# Patient Record
Sex: Female | Born: 2005 | Race: White | Hispanic: No | Marital: Single | Smoking: Never smoker
Health system: Southern US, Community
[De-identification: ages and names within clinical notes are randomized; demographics above are authoritative.]

## PROBLEM LIST (undated history)

## (undated) DIAGNOSIS — R569 Unspecified convulsions: Secondary | ICD-10-CM

## (undated) DIAGNOSIS — T751XXA Unspecified effects of drowning and nonfatal submersion, initial encounter: Secondary | ICD-10-CM

## (undated) DIAGNOSIS — K5900 Constipation, unspecified: Secondary | ICD-10-CM

## (undated) DIAGNOSIS — T7840XA Allergy, unspecified, initial encounter: Secondary | ICD-10-CM

## (undated) DIAGNOSIS — G40909 Epilepsy, unspecified, not intractable, without status epilepticus: Secondary | ICD-10-CM

## (undated) DIAGNOSIS — L503 Dermatographic urticaria: Secondary | ICD-10-CM

## (undated) HISTORY — DX: Unspecified effects of drowning and nonfatal submersion, initial encounter: T75.1XXA

## (undated) HISTORY — DX: Constipation, unspecified: K59.00

## (undated) HISTORY — DX: Allergy, unspecified, initial encounter: T78.40XA

## (undated) HISTORY — DX: Unspecified convulsions: R56.9

---
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
## 2023-01-03 ENCOUNTER — Other Ambulatory Visit (INDEPENDENT_AMBULATORY_CARE_PROVIDER_SITE_OTHER): Payer: Self-pay | Admitting: Neurology

## 2023-02-15 ENCOUNTER — Encounter (INDEPENDENT_AMBULATORY_CARE_PROVIDER_SITE_OTHER): Payer: Self-pay | Admitting: Neurology

## 2023-05-20 ENCOUNTER — Other Ambulatory Visit (INDEPENDENT_AMBULATORY_CARE_PROVIDER_SITE_OTHER): Payer: Self-pay | Admitting: Neurology

## 2023-05-20 NOTE — Telephone Encounter (Signed)
Last OV: 11-30-2022  Next OV: Not scheduled  Last Rx: 12-24-2022 with 5 refills (last dispensed: 03-23-2023)  B. Roten CMA

## 2023-06-27 ENCOUNTER — Ambulatory Visit (INDEPENDENT_AMBULATORY_CARE_PROVIDER_SITE_OTHER): Payer: Commercial Managed Care - PPO | Admitting: Neurology

## 2023-06-27 DIAGNOSIS — G40319 Generalized idiopathic epilepsy and epileptic syndromes, intractable, without status epilepticus: Secondary | ICD-10-CM

## 2023-06-27 NOTE — Progress Notes (Unsigned)
EEG complete - results pending 

## 2023-06-29 NOTE — Procedures (Signed)
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  This is a follow-up EEG for evaluation of epileptiform discharges.  Medication:    Lamictal, Onfi           Procedure: The tracing was carried out on a 32 channel digital Cadwell recorder reformatted into 16 channel montages with 1 devoted to EKG.  The 10 /20 international system electrode placement was used. Recording was done during awake state. Recording time 33 minutes.   Description of findings: Background rhythm consists of amplitude of     35 microvolt and frequency of 10-11 hertz posterior dominant rhythm. There was normal anterior posterior gradient noted. Background was well organized, continuous and symmetric with no focal slowing. There was muscle artifact noted. Hyperventilation resulted in slowing of the background activity. Photic stimulation was not performed. Throughout the recording there were no focal or generalized epileptiform activities in the form of spikes or sharps noted. There were no transient rhythmic activities or electrographic seizures noted. One lead EKG rhythm strip revealed sinus rhythm at a rate of 75 bpm.  Impression: This EEG during awake state and without photic stimulation is normal.  Please note that normal EEG does not exclude epilepsy, clinical correlation is indicated.     Keturah Shavers, MD

## 2023-07-19 ENCOUNTER — Ambulatory Visit (INDEPENDENT_AMBULATORY_CARE_PROVIDER_SITE_OTHER): Payer: Commercial Managed Care - PPO | Admitting: Neurology

## 2023-07-26 ENCOUNTER — Other Ambulatory Visit (INDEPENDENT_AMBULATORY_CARE_PROVIDER_SITE_OTHER): Payer: Self-pay | Admitting: Neurology

## 2023-07-26 DIAGNOSIS — G40319 Generalized idiopathic epilepsy and epileptic syndromes, intractable, without status epilepticus: Secondary | ICD-10-CM

## 2023-07-26 DIAGNOSIS — G253 Myoclonus: Secondary | ICD-10-CM

## 2023-07-26 NOTE — Telephone Encounter (Signed)
Last OV 06/27/23 Next OV 09/19/23 Lamictal 200 mg ER 11/30/2022 3 RF Lamictal 50 mg ER 11/30/2022 3 rf Per dispense hx has not missed picking up medication

## 2023-08-29 ENCOUNTER — Other Ambulatory Visit (INDEPENDENT_AMBULATORY_CARE_PROVIDER_SITE_OTHER): Payer: Self-pay | Admitting: Neurology

## 2023-08-29 DIAGNOSIS — G253 Myoclonus: Secondary | ICD-10-CM

## 2023-08-29 DIAGNOSIS — G40319 Generalized idiopathic epilepsy and epileptic syndromes, intractable, without status epilepticus: Secondary | ICD-10-CM

## 2023-09-19 ENCOUNTER — Encounter (INDEPENDENT_AMBULATORY_CARE_PROVIDER_SITE_OTHER): Payer: Self-pay | Admitting: Neurology

## 2023-09-19 ENCOUNTER — Ambulatory Visit (INDEPENDENT_AMBULATORY_CARE_PROVIDER_SITE_OTHER): Payer: Commercial Managed Care - PPO | Admitting: Neurology

## 2023-09-19 VITALS — BP 118/58 | HR 68

## 2023-09-19 DIAGNOSIS — G253 Myoclonus: Secondary | ICD-10-CM

## 2023-09-19 DIAGNOSIS — G40309 Generalized idiopathic epilepsy and epileptic syndromes, not intractable, without status epilepticus: Secondary | ICD-10-CM

## 2023-09-19 DIAGNOSIS — G40319 Generalized idiopathic epilepsy and epileptic syndromes, intractable, without status epilepticus: Secondary | ICD-10-CM | POA: Diagnosis not present

## 2023-09-19 MED ORDER — CLOBAZAM 10 MG PO TABS: ORAL_TABLET | ORAL | 5 refills | Status: DC

## 2023-09-19 MED ORDER — LAMOTRIGINE ER 50 MG PO TB24
ORAL_TABLET | ORAL | 3 refills | Status: DC
Start: 2023-09-19 — End: 2023-11-15

## 2023-09-19 MED ORDER — LAMOTRIGINE ER 100 MG PO TB24
ORAL_TABLET | ORAL | 3 refills | Status: DC
Start: 2023-09-19 — End: 2023-11-15

## 2023-09-19 MED ORDER — LAMOTRIGINE ER 200 MG PO TB24
ORAL_TABLET | ORAL | 3 refills | Status: DC
Start: 2023-09-19 — End: 2023-11-15
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
## 2023-09-19 NOTE — Patient Instructions (Addendum)
Continue the same dose of Lamictal at 350 mg Continue Onfi at the same dose of 20 mg every night We will schedule for blood work Continue with adequate sleep and limited screen time Avoid straight sunlight We will send a referral to adult neurology No follow-up visit needed at this time

## 2023-09-21 ENCOUNTER — Encounter: Payer: Self-pay | Admitting: Neurology

## 2023-10-31 ENCOUNTER — Telehealth (INDEPENDENT_AMBULATORY_CARE_PROVIDER_SITE_OTHER): Payer: Self-pay | Admitting: Neurology

## 2023-10-31 NOTE — Telephone Encounter (Signed)
Left mom message stating that I seen her message about medication, I am sending this message over to Dr.Nab to further advise on the medication

## 2023-10-31 NOTE — Telephone Encounter (Signed)
  Name of who is calling: Tana Coast Relationship to Patient: Mom  Best contact number: [phone]  Provider they see: Dr.Nab  Reason for call: Mom Nurse Line 10/29/23 and stated that Rosemaria missed her On[place] the night before 10/28/23 and wanted some dosing advice for this since she had to take the dose that morning and it usually scheduled for her to take at night.      PRESCRIPTION REFILL ONLY  Name of prescription:  Pharmacy:

## 2023-11-15 ENCOUNTER — Ambulatory Visit: Payer: Commercial Managed Care - PPO | Admitting: Neurology

## 2023-11-15 ENCOUNTER — Encounter: Payer: Self-pay | Admitting: Neurology

## 2023-11-15 DIAGNOSIS — G253 Myoclonus: Secondary | ICD-10-CM

## 2023-11-15 DIAGNOSIS — G40319 Generalized idiopathic epilepsy and epileptic syndromes, intractable, without status epilepticus: Secondary | ICD-10-CM

## 2023-11-15 MED ORDER — LAMOTRIGINE ER 200 MG PO TB24: ORAL_TABLET | ORAL | 3 refills | Status: DC

## 2023-11-15 MED ORDER — CLOBAZAM 10 MG PO TABS: ORAL_TABLET | ORAL | 3 refills | Status: DC
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
## 2023-12-23 ENCOUNTER — Other Ambulatory Visit: Payer: Self-pay | Admitting: Neurology

## 2023-12-23 NOTE — Telephone Encounter (Signed)
1. Which medications need to be refilled? (please list name of each medication and dose if known) cloBAZam (ONFI) 10 MG tablet   2. Which pharmacy/location (including street and city if local pharmacy) is medication to be sent to? [facility], [place] - [address]   3. Do they need a 30 day or 90 day supply?

## 2023-12-26 ENCOUNTER — Telehealth: Payer: Self-pay | Admitting: Neurology

## 2023-12-26 MED ORDER — CLOBAZAM 10 MG PO TABS: ORAL_TABLET | ORAL | 3 refills | Status: DC

## 2023-12-26 NOTE — Telephone Encounter (Signed)
PER pharmacy PA needed needed for Clobazm.  PA team please start a PA

## 2023-12-26 NOTE — Telephone Encounter (Signed)
Pt's mother called in wanting to find out what the pt is supposed to do without her clobazam tomorrow? She is in [place] at school.

## 2023-12-26 NOTE — Telephone Encounter (Signed)
Patient needs to speak to someone about  the Clobazam refill. The pharmacy was to send something to [place] about the refill   [facility] [address]

## 2023-12-27 MED ORDER — CLOBAZAM 20 MG PO TABS: ORAL_TABLET | ORAL | 0 refills | Status: DC

## 2023-12-27 NOTE — Telephone Encounter (Signed)
Mother advise of [employer] to printed or send the coupon in a text to her daughter.    [facility] [address] [place]

## 2023-12-27 NOTE — Telephone Encounter (Signed)
Rx sent for temporary supply of Clobazam. Pls make sure mother and patient aware that [facility] , so she only needs to take 1 tablet every night (right now she is on clobazam 10mg  taking 2 tabs every night, so it is the same dose, just make sure she does not take more than prescribed). Thank you!!

## 2024-01-03 ENCOUNTER — Telehealth: Payer: Self-pay | Admitting: Pharmacy Technician

## 2024-01-03 ENCOUNTER — Other Ambulatory Visit (HOSPITAL_COMMUNITY): Payer: Self-pay

## 2024-01-03 NOTE — Telephone Encounter (Signed)
 Pharmacy Patient Advocate Encounter   Received notification from Fax that prior authorization for CLOBAZAM  20MG  is required/requested.   Insurance verification completed.   The patient is insured through [facility] .   Per test claim: PA required; PA submitted to above mentioned insurance via Prompt PA Key/confirmation #/EOC [id number] Status is pending

## 2024-01-03 NOTE — Telephone Encounter (Signed)
 Pharmacy Patient Advocate Encounter  Received notification from [employer] that Prior Authorization for CLOBAZAM  20MG  has been APPROVED from 2.4.25 to 2.3.26. Ran test claim, Copay is $15. This test claim was processed through [facility]- copay amounts may vary at other pharmacies due to pharmacy/plan contracts, or as the patient moves through the different stages of their insurance plan.   PA #/Case ID/Reference #: [id number]

## 2024-01-23 ENCOUNTER — Telehealth: Payer: Self-pay | Admitting: Neurology

## 2024-01-23 DIAGNOSIS — G253 Myoclonus: Secondary | ICD-10-CM

## 2024-01-23 DIAGNOSIS — G40319 Generalized idiopathic epilepsy and epileptic syndromes, intractable, without status epilepticus: Secondary | ICD-10-CM

## 2024-01-23 NOTE — Telephone Encounter (Signed)
 Caller stated she would like to speak with Herbert Seta concerning daughters medication.

## 2024-01-24 MED ORDER — CLOBAZAM 20 MG PO TABS: ORAL_TABLET | ORAL | 3 refills | Status: DC

## 2024-01-24 MED ORDER — LAMOTRIGINE ER 200 MG PO TB24: ORAL_TABLET | ORAL | 3 refills | Status: DC

## 2024-01-24 NOTE — Telephone Encounter (Signed)
 Rx sent, thanks

## 2024-01-27 ENCOUNTER — Telehealth: Payer: Self-pay

## 2024-01-27 NOTE — Telephone Encounter (Signed)
 Pt needs a PA for lamotrigine ER 200mg 

## 2024-02-01 ENCOUNTER — Telehealth: Payer: Self-pay

## 2024-02-01 ENCOUNTER — Other Ambulatory Visit (HOSPITAL_COMMUNITY): Payer: Self-pay

## 2024-02-01 NOTE — Telephone Encounter (Signed)
 PA request has been Cancelled. New Encounter has been or will be created for follow up. For additional info see Pharmacy Prior Auth telephone encounter from 03/05.  Refill too soon

## 2024-02-01 NOTE — Telephone Encounter (Signed)
*  [facility]  Pharmacy Patient Advocate Encounter   Received notification from Pt Calls Messages that prior authorization for Lamotrigine ER 200mg  is required/requested.   Insurance verification completed.   The patient is insured through  [employer]  .   Per test claim: Refill too soon. PA is not needed at this time. Medication was filled 01/22/2024. Next eligible fill date is 02/14/2024.

## 2024-02-07 ENCOUNTER — Other Ambulatory Visit (HOSPITAL_COMMUNITY): Payer: Self-pay

## 2024-02-08 ENCOUNTER — Other Ambulatory Visit: Payer: Self-pay

## 2024-02-08 MED ORDER — CLOBAZAM 20 MG PO TABS: ORAL_TABLET | ORAL | 3 refills | Status: DC

## 2024-02-08 NOTE — Telephone Encounter (Signed)
 Pt needs a PA  for Lamotrigine ER 200mg  request from cover my meds from the pharmacy.

## 2024-02-13 ENCOUNTER — Ambulatory Visit: Payer: Self-pay | Admitting: Neurology

## 2024-08-06 ENCOUNTER — Telehealth: Payer: Self-pay | Admitting: Neurology

## 2024-08-06 NOTE — Telephone Encounter (Signed)
 She is on 2 seizure medications, pls confirm which ones and how much she took this morning, thanks

## 2024-08-06 NOTE — Telephone Encounter (Signed)
 Pt called and LM with AN. She is unsure if she took her epilepsy medication last night 9/5 so she took a dose this morning 9/6. She wants to know if she will be ok if she did take her night time dose

## 2024-08-07 NOTE — Telephone Encounter (Signed)
 Called pt and told her per Dr Georjean to be put on waiting list , sent call to front

## 2024-08-16 ENCOUNTER — Other Ambulatory Visit: Payer: Self-pay | Admitting: Neurology

## 2024-08-16 ENCOUNTER — Other Ambulatory Visit: Payer: Self-pay | Admitting: *Deleted

## 2024-08-19 ENCOUNTER — Other Ambulatory Visit: Payer: Self-pay | Admitting: Neurology

## 2024-08-21 ENCOUNTER — Telehealth: Payer: Self-pay | Admitting: Neurology

## 2024-08-21 ENCOUNTER — Ambulatory Visit (INDEPENDENT_AMBULATORY_CARE_PROVIDER_SITE_OTHER): Admitting: Neurology

## 2024-08-21 ENCOUNTER — Other Ambulatory Visit

## 2024-08-21 ENCOUNTER — Encounter: Payer: Self-pay | Admitting: Neurology

## 2024-08-21 VITALS — BP 114/78 | HR 85 | Ht 65.0 in | Wt 119.0 lb

## 2024-08-21 DIAGNOSIS — R400 Somnolence: Secondary | ICD-10-CM

## 2024-08-21 DIAGNOSIS — G40319 Generalized idiopathic epilepsy and epileptic syndromes, intractable, without status epilepticus: Secondary | ICD-10-CM | POA: Diagnosis not present

## 2024-08-21 MED ORDER — CLOBAZAM 20 MG PO TABS: 20.0000 mg | ORAL_TABLET | Freq: Every day | ORAL | 3 refills | Status: DC

## 2024-08-21 MED ORDER — VALTOCO 15 MG DOSE 2 X 7.5 MG/0.1ML NA LQPK: NASAL | 5 refills | Status: AC
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
## 2024-08-21 NOTE — Patient Instructions (Addendum)
 Good to see you.  Have bloodwork done for Lamictal  level [address]  2. Schedule home sleep study  3. Depending on Lamictal  level, we may plan to increase dose of Lamictal  to 500mg  every night. For now, continue all medications  4. Follow-up in 3-4 months, call for any changes   Seizure Precautions: 1. If medication has been prescribed for you to prevent seizures, take it exactly as directed.  Do not stop taking the medicine without talking to your doctor first, even if you have not had a seizure in a long time.   2. Avoid activities in which a seizure would cause danger to yourself or to others.  Don't operate dangerous machinery, swim alone, or climb in high or dangerous places, such as on ladders, roofs, or girders.  Do not drive unless your doctor says you may.  3. If you have any warning that you may have a seizure, lay down in a safe place where you can't hurt yourself.    4.  No driving for 6 months from last seizure, as per [place]  state law.   Please refer to the following link on the Epilepsy Foundation of America's website for more information: http://www.epilepsyfoundation.org/answerplace/Social/driving/drivingu.cfm   5.  Maintain good sleep hygiene. Avoid alcohol.  6.  Notify your neurology if you are planning pregnancy or if you become pregnant.  7.  Contact your doctor if you have any problems that may be related to the medicine you are taking.  8.  Call 911 and bring the patient back to the ED if:        A.  The seizure lasts longer than 5 minutes.       B.  The patient doesn't awaken shortly after the seizure  C.  The patient has new problems such as difficulty seeing, speaking or moving  D.  The patient was injured during the seizure  E.  The patient has a temperature over 102 F (39C)  F.  The patient vomited and now is having trouble breathing

## 2024-08-21 NOTE — Telephone Encounter (Signed)
 Pt. Would like to discuss and follow thru with VNS therapy, pls call bk with details

## 2024-08-23 NOTE — Telephone Encounter (Signed)
 Pt called back today wanting to speak to someone about the message she left the other day ( see below) please call back today    Would like to discuss and follow thru with VNS therapy, pls call bk with details

## 2024-08-23 NOTE — Telephone Encounter (Signed)
 Notified message below and voiced understanding.

## 2024-08-23 NOTE — Telephone Encounter (Signed)
 Pls let her know that the next step would be setting up an appointment with the neurosurgeon to discuss the procedure. The VNS representative reaches out to discuss benefits and answer all her questions. I have sent in the order, she should be hearing from them soon, thanks

## 2024-08-24 LAB — LAMOTRIGINE LEVEL: Lamotrigine Lvl: 9.7 ug/mL (ref 2.5–15.0)

## 2024-08-29 ENCOUNTER — Ambulatory Visit: Payer: Self-pay | Admitting: Neurology

## 2024-08-29 MED ORDER — LAMOTRIGINE ER 100 MG PO TB24: ORAL_TABLET | ORAL | 3 refills | Status: DC

## 2024-08-29 NOTE — Progress Notes (Signed)
 Notified pt --lab result w/ medication instruction.

## 2024-08-30 ENCOUNTER — Other Ambulatory Visit: Payer: Self-pay | Admitting: *Deleted

## 2024-08-30 DIAGNOSIS — G40319 Generalized idiopathic epilepsy and epileptic syndromes, intractable, without status epilepticus: Secondary | ICD-10-CM

## 2024-08-30 DIAGNOSIS — Z9689 Presence of other specified functional implants: Secondary | ICD-10-CM

## 2024-09-10 ENCOUNTER — Telehealth: Payer: Self-pay | Admitting: Neurology

## 2024-09-10 NOTE — Telephone Encounter (Signed)
 Referral has been sent

## 2024-09-10 NOTE — Telephone Encounter (Signed)
 Office has not rcvd referral as of 09/04/24, please refax Phone# [phone] Dr. Lanis (surgeon) Fax# [phone] ATTN: NEW PATIENT COORDINATOR- REQUESTING VNS CONSULT

## 2024-09-14 ENCOUNTER — Telehealth: Payer: Self-pay | Admitting: Neurology

## 2024-09-14 NOTE — Telephone Encounter (Signed)
 Patient wants to get more information about the VNS  please call

## 2024-09-17 NOTE — Telephone Encounter (Signed)
 Pt called an informed that the VNS nurse should be reaching out to her to giver information about the VNS

## 2024-09-17 NOTE — Telephone Encounter (Signed)
 Pls let her know we contacted the VNS representative, their nurse will call her to do education. Let us  know if any questions.

## 2024-10-09 ENCOUNTER — Encounter: Payer: Self-pay | Admitting: Neurology

## 2024-10-11 ENCOUNTER — Encounter: Payer: Self-pay | Admitting: Neurology

## 2024-10-12 ENCOUNTER — Telehealth: Payer: Self-pay | Admitting: Neurology

## 2024-10-12 NOTE — Telephone Encounter (Signed)
 Pt stated the insurance will not cover the prescription called   lamoTRIgine  (LAMICTAL  XR) 100 MG 24 hour tablet, and  LamoTRIgine  200 MG TB24 24 hour tablet   Until a certain date. Pt is running out of both medications . Please call. Thanks

## 2024-10-15 NOTE — Telephone Encounter (Signed)
 Pt called she was able to get her medication

## 2024-11-12 ENCOUNTER — Other Ambulatory Visit: Payer: Self-pay | Admitting: Neurology

## 2024-11-12 DIAGNOSIS — G253 Myoclonus: Secondary | ICD-10-CM

## 2024-11-12 DIAGNOSIS — G40319 Generalized idiopathic epilepsy and epileptic syndromes, intractable, without status epilepticus: Secondary | ICD-10-CM

## 2024-11-12 MED ORDER — LAMOTRIGINE ER 200 MG PO TB24: ORAL_TABLET | ORAL | 3 refills | Status: AC

## 2024-11-12 MED ORDER — LAMOTRIGINE ER 100 MG PO TB24: ORAL_TABLET | ORAL | 3 refills | Status: DC

## 2024-11-12 MED ORDER — CLOBAZAM 20 MG PO TABS: 20.0000 mg | ORAL_TABLET | Freq: Every day | ORAL | 3 refills | Status: AC

## 2024-11-12 NOTE — Telephone Encounter (Signed)
 Star called in wanting to follow up on how long it would take for Rx to be sent in.   [facility]: [phone]

## 2024-11-12 NOTE — Telephone Encounter (Signed)
 Pt called in this afternoon and she would like to get a refill on the prescriptions called: cloBAZam  (ONFI ) 20 MG tablet and LamoTRIgine  . Pt needs the prescription to go to [facility] [place] . Thanks

## 2024-11-13 MED ORDER — LAMOTRIGINE ER 100 MG PO TB24: ORAL_TABLET | ORAL | 3 refills | Status: AC

## 2024-11-13 NOTE — Addendum Note (Signed)
 Addended by: TAFT MOATS on: 11/13/2024 07:52 AM   Modules accepted: Orders
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
## 2024-12-18 ENCOUNTER — Encounter: Payer: Self-pay | Admitting: Neurology

## 2024-12-18 ENCOUNTER — Ambulatory Visit: Admitting: Neurology

## 2025-01-01 ENCOUNTER — Telehealth: Payer: Self-pay | Admitting: Neurology

## 2025-01-02 NOTE — Telephone Encounter (Signed)
 Telephone call from patient, Patient really scared crying.   Patient states she think she took a total of 1500 mg of her Lamictal , and 60 mg of the Clobazam  yesterday and she feels weird.   Nausea,Off balance and vision blurry.    Spoke to [facility], If the patient has taken or over taken her medication please have patient to hold off on taking her medication today.  Resume 500 mg Lamictal  and 20 mg of Clobazam  tomorrow night.  Please give us  a call if her symptoms persist or get worse.

## 2025-01-03 ENCOUNTER — Telehealth: Payer: Self-pay

## 2025-01-03 NOTE — Telephone Encounter (Signed)
 Pt called she is having auras of seizures spoke with DR Georjean she advised for her to take 200 mg of the Lamotrigine  now then tonight take the other 300 mg of the lamotrigine  with her Onfi  then tomorrow she can go back to normal schedule,

## 2025-02-06 ENCOUNTER — Ambulatory Visit: Admitting: Neurology
